# Patient Record
Sex: Male | Born: 1990 | Race: Black or African American | Hispanic: No | Marital: Single | Smoking: Never smoker
Health system: Southern US, Community
[De-identification: ages and names within clinical notes are randomized; demographics above are authoritative.]

## PROBLEM LIST (undated history)

## (undated) HISTORY — PX: HAND SURGERY: SHX662

---

## 2017-05-01 ENCOUNTER — Emergency Department (HOSPITAL_COMMUNITY): Payer: No Typology Code available for payment source

## 2017-05-01 ENCOUNTER — Encounter (HOSPITAL_COMMUNITY): Payer: Self-pay

## 2017-05-01 ENCOUNTER — Emergency Department (HOSPITAL_COMMUNITY)
Admission: EM | Admit: 2017-05-01 | Discharge: 2017-05-01 | Disposition: A | Discharge status: Home / Self Care | Payer: No Typology Code available for payment source | Attending: Emergency Medicine | Admitting: Emergency Medicine

## 2017-05-01 DIAGNOSIS — S199XXA Unspecified injury of neck, initial encounter: Secondary | ICD-10-CM | POA: Diagnosis present

## 2017-05-01 DIAGNOSIS — Y9389 Activity, other specified: Secondary | ICD-10-CM | POA: Diagnosis not present

## 2017-05-01 DIAGNOSIS — Y999 Unspecified external cause status: Secondary | ICD-10-CM | POA: Diagnosis not present

## 2017-05-01 DIAGNOSIS — M549 Dorsalgia, unspecified: Secondary | ICD-10-CM | POA: Insufficient documentation

## 2017-05-01 DIAGNOSIS — S161XXA Strain of muscle, fascia and tendon at neck level, initial encounter: Secondary | ICD-10-CM | POA: Insufficient documentation

## 2017-05-01 DIAGNOSIS — Y92410 Unspecified street and highway as the place of occurrence of the external cause: Secondary | ICD-10-CM | POA: Insufficient documentation

## 2017-05-01 MED ORDER — CYCLOBENZAPRINE HCL 10 MG PO TABS: 10.0000 mg | ORAL_TABLET | Freq: Two times a day (BID) | ORAL | 0 refills | Status: AC | PRN

## 2017-05-01 MED ORDER — METHOCARBAMOL 500 MG PO TABS
500.0000 mg | ORAL_TABLET | Freq: Once | ORAL | Status: AC
  Administered 2017-05-01: 500 mg via ORAL
  Filled 2017-05-01: qty 1

## 2017-05-01 MED ORDER — IBUPROFEN 200 MG PO TABS
600.0000 mg | ORAL_TABLET | Freq: Once | ORAL | Status: AC
  Administered 2017-05-01: 12:00:00 600 mg via ORAL
  Filled 2017-05-01: qty 1

## 2017-05-01 NOTE — Discharge Instructions (Signed)
Take Ibuprofen three times daily for the next week. Take this medicine with food. °Take muscle relaxer at bedtime to help you sleep. This medicine makes you drowsy so do not take before driving or work °Use a heating pad for sore muscles - use for 20 minutes several times a day °Return for worsening symptoms ° °

## 2017-05-01 NOTE — ED Notes (Signed)
ED Provider at bedside. 
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
